# Patient Record
Sex: Female | Born: 1965 | Race: Asian | Hispanic: No | Marital: Married | Smoking: Never smoker
Health system: Southern US, Community
[De-identification: ages and names within clinical notes are randomized; demographics above are authoritative.]

---

## 2001-08-20 ENCOUNTER — Ambulatory Visit (HOSPITAL_COMMUNITY): Admission: RE | Admit: 2001-08-20 | Discharge: 2001-08-20 | Payer: Self-pay | Admitting: *Deleted

## 2001-12-26 ENCOUNTER — Inpatient Hospital Stay (HOSPITAL_COMMUNITY): Admission: AD | Admit: 2001-12-26 | Discharge: 2001-12-28 | Payer: Self-pay | Admitting: *Deleted

## 2005-01-07 ENCOUNTER — Emergency Department (HOSPITAL_COMMUNITY): Admission: EM | Admit: 2005-01-07 | Discharge: 2005-01-07 | Payer: Self-pay | Admitting: Emergency Medicine

## 2006-08-10 IMAGING — CT CT ABDOMEN W/ CM
1 of 4 series · 14 of 32 positions shown, 19 images · IV contrast (APPLIED)
Comparison: none

CLINICAL DATA: Lower abdominal pain. Rectal bleeding.  Hepatitis B.
 ABDOMEN CT WITH CONTRAST:
TECHNIQUE: Multidetector CT imaging of the abdomen was performed following the standard protocol during bolus administration of intravenous contrast.
 Contrast:  125 cc Omnipaque 300
TECHNIQUE: Multidetector CT imaging of the pelvis was performed following the standard protocol during bolus administration of intravenous contrast.

[Series 2: abd_pel 5.0 b40f st · axial · 0.59mm/px · z∈[-720,-375]mm · 14 of 79 slices shown, 19 images]
[im 5/79  soft-tissue]
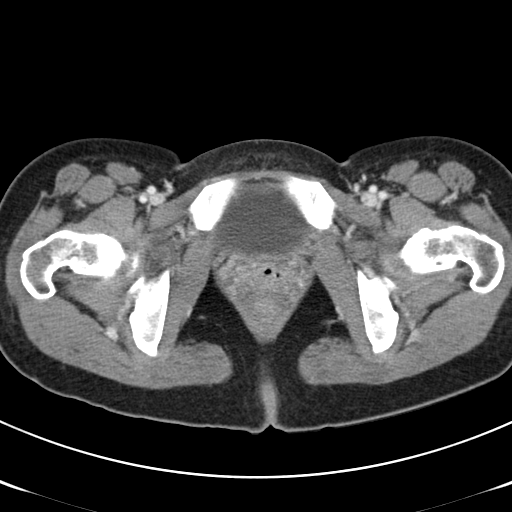
[im 5/79  bone]
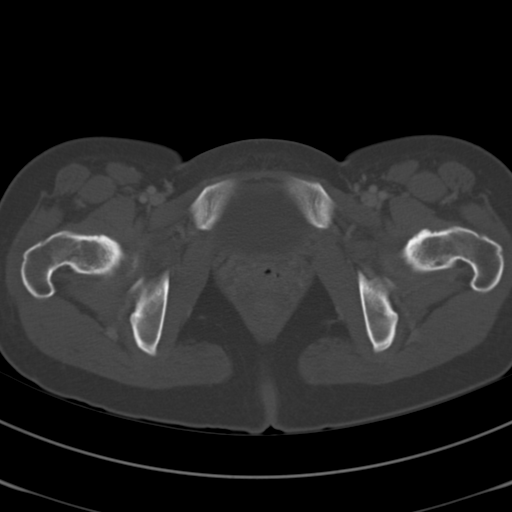
[im 10/79  soft-tissue]
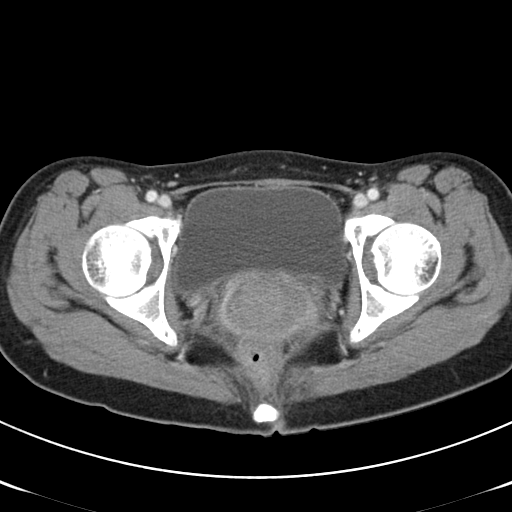
[im 19/79  soft-tissue]
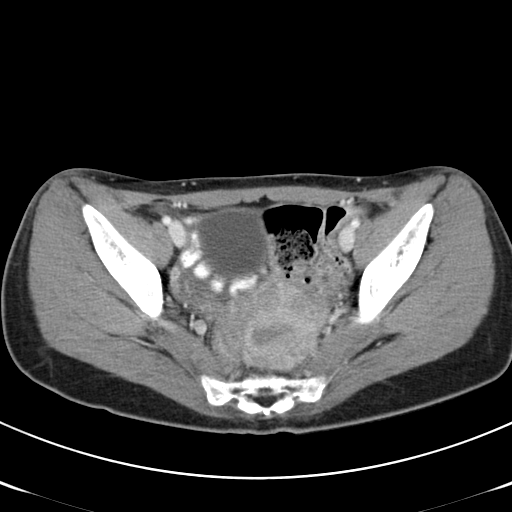
[im 23/79  soft-tissue]
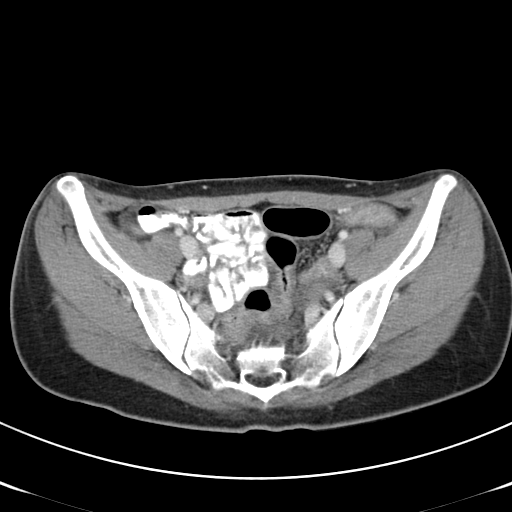
[im 28/79  soft-tissue]
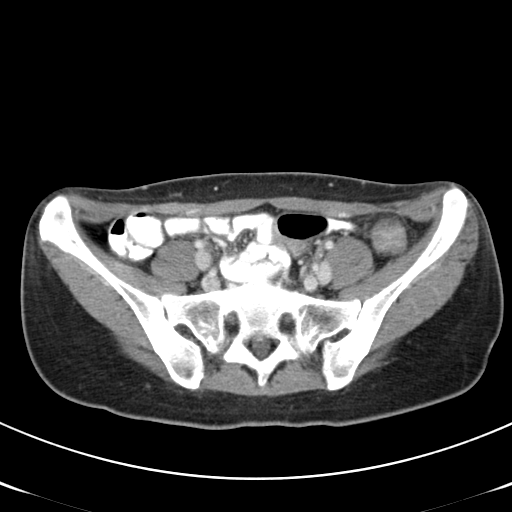
[im 33/79  soft-tissue]
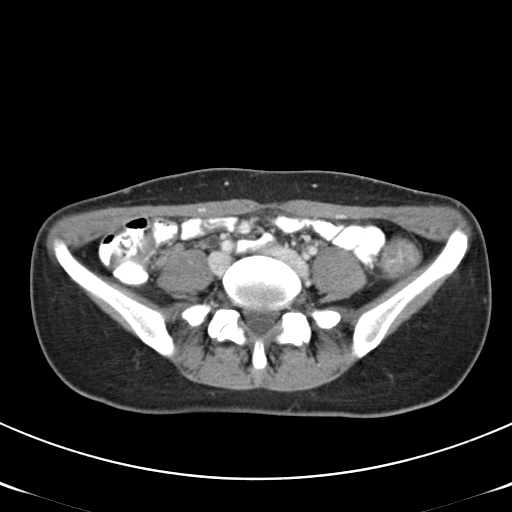
[im 42/79  soft-tissue]
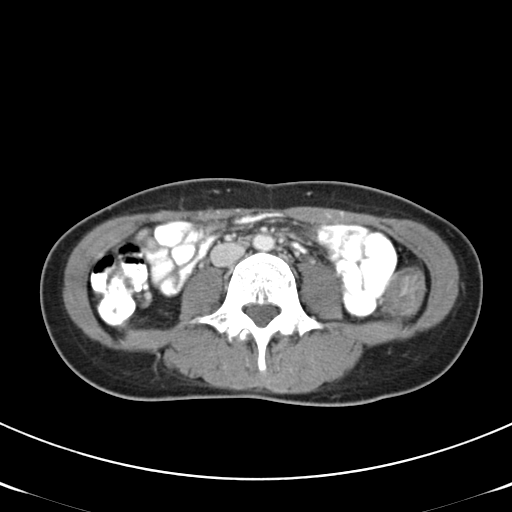
[im 46/79  soft-tissue]
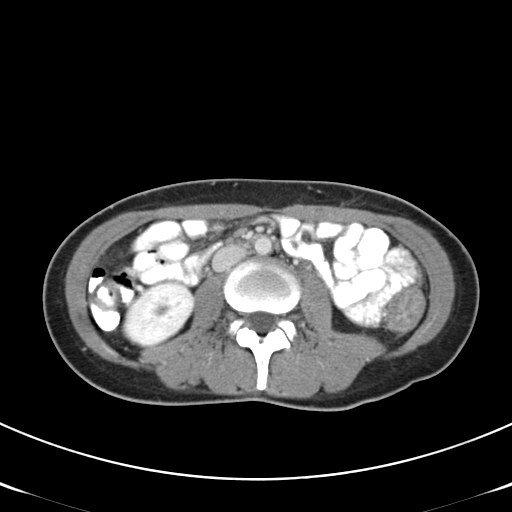
[im 51/79  soft-tissue]
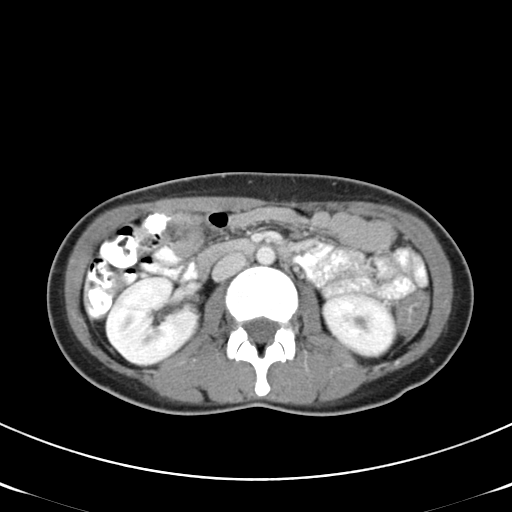
[im 51/79  bone]
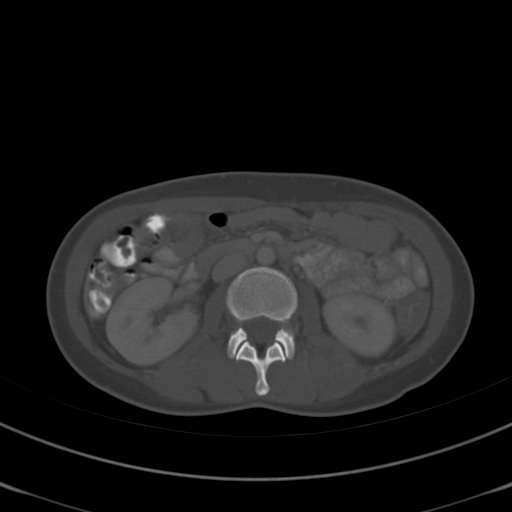
[im 56/79  soft-tissue]
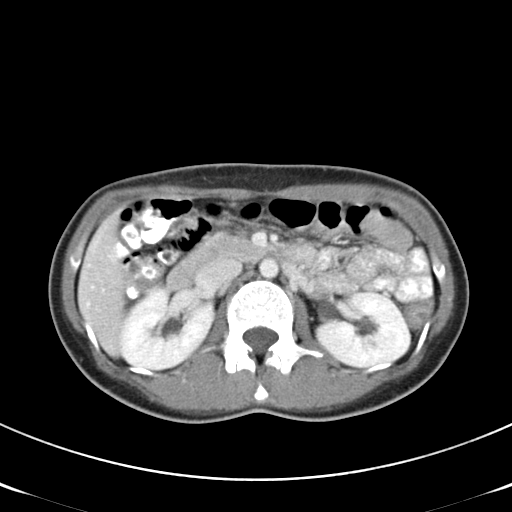
[im 60/79  soft-tissue]
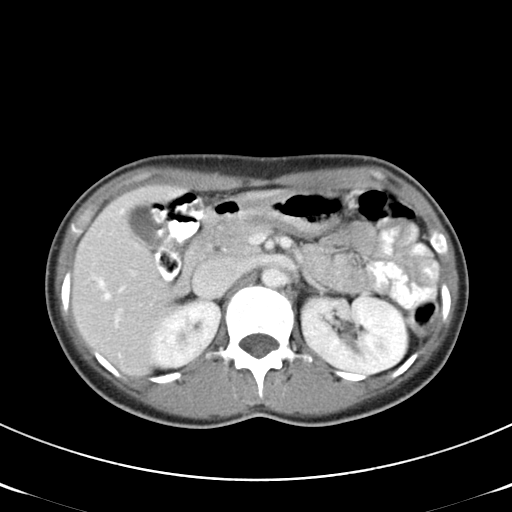
[im 60/79  lung]
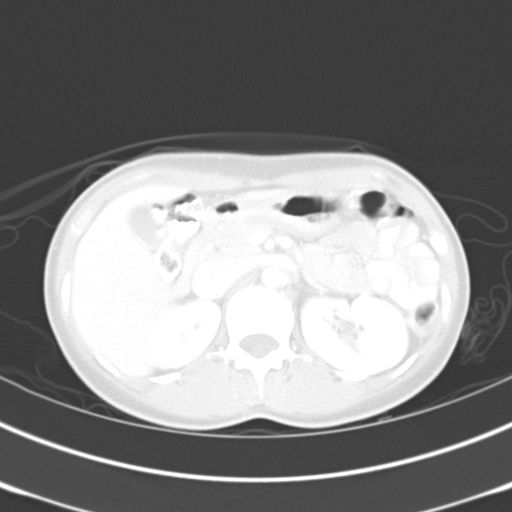
[im 65/79  lung]
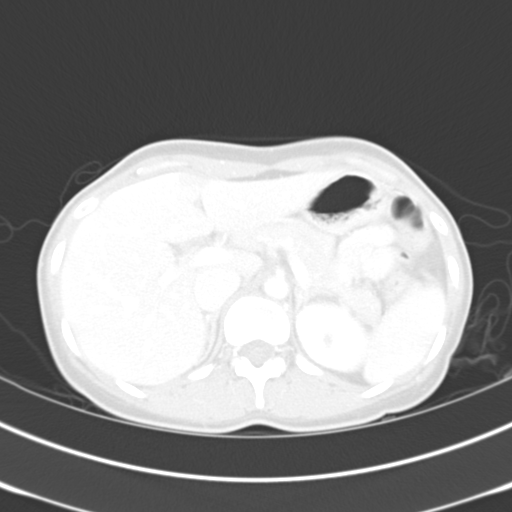
[im 69/79  soft-tissue]
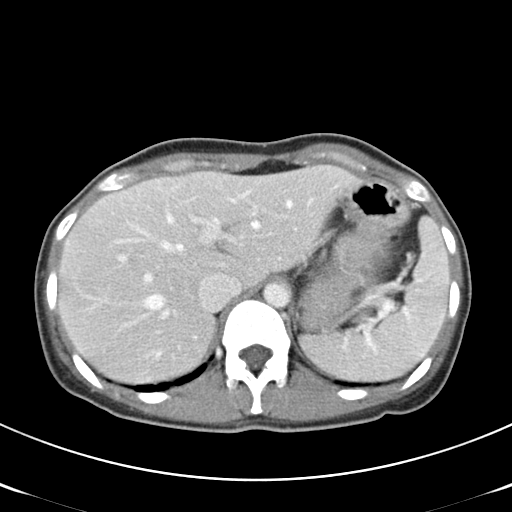
[im 69/79  lung]
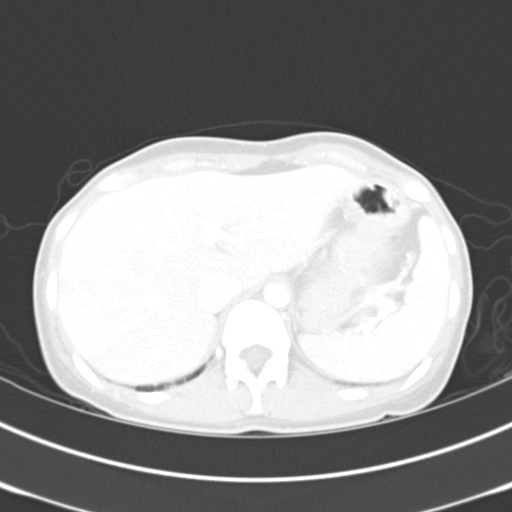
[im 74/79  soft-tissue]
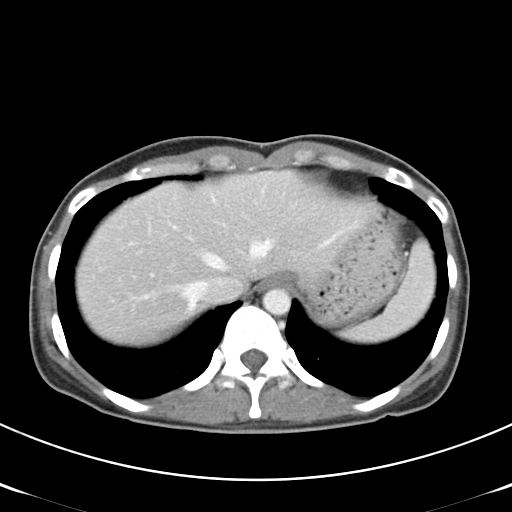
[im 74/79  lung]
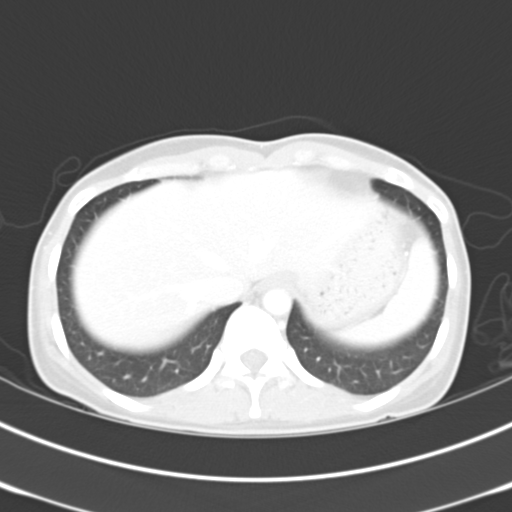

[14 of 32 positions shown; findings below may reference images not displayed]

FINDINGS: Lung bases are clear.  No pleural or pericardial fluid.  The liver does not show any focal lesions of significance.  There is a 3 mm cyst in the right lobe and there is some fatty change adjacent to the falciform ligament, a common normal finding.  No calcifications gallstones.  The spleen is normal.  The pancreas is normal.  The adrenal glands are normal.  The kidneys are normal.  The aorta and IVC are normal.  No retroperitoneal mass or adenopathy.  No free intraperitoneal fluid or air.  Evaluation of the bowel shows colonic wall thickening involving the entire descending colon.  The right colon and transverse colon appear normal.  No small bowel pathology is seen.
IMPRESSION: Descending colitis.  
 PELVIS CT WITH CONTRAST:
FINDINGS: No free fluid. The uterus and adnexal regions appear normal.  Again evident in the pelvis is wall edema of the descending and to a lesser extent the sigmoid colon consistent with colitis.  This is nonspecific and could be infectious or could be due to Crohn disease or ulcerative colitis.
IMPRESSION: Colitis of the left colon without evidence of perforation or abscess.

## 2022-05-25 ENCOUNTER — Emergency Department (HOSPITAL_BASED_OUTPATIENT_CLINIC_OR_DEPARTMENT_OTHER): Payer: No Typology Code available for payment source

## 2022-05-25 ENCOUNTER — Encounter (HOSPITAL_BASED_OUTPATIENT_CLINIC_OR_DEPARTMENT_OTHER): Payer: Self-pay | Admitting: Urology

## 2022-05-25 ENCOUNTER — Emergency Department (HOSPITAL_BASED_OUTPATIENT_CLINIC_OR_DEPARTMENT_OTHER)
Admission: EM | Admit: 2022-05-25 | Discharge: 2022-05-25 | Discharge status: Against Medical Advice | Payer: No Typology Code available for payment source | Attending: Emergency Medicine | Admitting: Emergency Medicine

## 2022-05-25 DIAGNOSIS — R0789 Other chest pain: Secondary | ICD-10-CM

## 2022-05-25 DIAGNOSIS — Z5321 Procedure and treatment not carried out due to patient leaving prior to being seen by health care provider: Secondary | ICD-10-CM | POA: Insufficient documentation

## 2022-05-25 DIAGNOSIS — S0990XA Unspecified injury of head, initial encounter: Secondary | ICD-10-CM | POA: Diagnosis not present

## 2022-05-25 DIAGNOSIS — S060X0A Concussion without loss of consciousness, initial encounter: Secondary | ICD-10-CM

## 2022-05-25 DIAGNOSIS — Y92410 Unspecified street and highway as the place of occurrence of the external cause: Secondary | ICD-10-CM | POA: Diagnosis not present

## 2022-05-25 DIAGNOSIS — S299XXA Unspecified injury of thorax, initial encounter: Secondary | ICD-10-CM | POA: Insufficient documentation

## 2022-05-25 MED ORDER — ACETAMINOPHEN 500 MG PO TABS
1000.0000 mg | ORAL_TABLET | Freq: Once | ORAL | Status: AC
  Administered 2022-05-25: 1000 mg via ORAL
  Filled 2022-05-25: qty 2
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
## 2022-05-25 NOTE — ED Notes (Signed)
Patient verbalized understanding of discharge instructions and reasons to return to the ED 
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
